# Patient Record
Sex: Female | Born: 1970 | Race: White | Hispanic: No | Marital: Married | Smoking: Former smoker
Health system: Southern US, Community
[De-identification: ages and names within clinical notes are randomized; demographics above are authoritative.]

## PROBLEM LIST (undated history)

## (undated) DIAGNOSIS — J309 Allergic rhinitis, unspecified: Secondary | ICD-10-CM

## (undated) DIAGNOSIS — G479 Sleep disorder, unspecified: Secondary | ICD-10-CM

## (undated) DIAGNOSIS — G4730 Sleep apnea, unspecified: Secondary | ICD-10-CM

## (undated) DIAGNOSIS — I5189 Other ill-defined heart diseases: Secondary | ICD-10-CM

## (undated) DIAGNOSIS — G43909 Migraine, unspecified, not intractable, without status migrainosus: Secondary | ICD-10-CM

## (undated) HISTORY — DX: Other ill-defined heart diseases: I51.89

## (undated) HISTORY — DX: Sleep disorder, unspecified: G47.9

## (undated) HISTORY — DX: Migraine, unspecified, not intractable, without status migrainosus: G43.909

## (undated) HISTORY — PX: VAGINAL HYSTERECTOMY: SUR661

## (undated) HISTORY — DX: Sleep apnea, unspecified: G47.30

## (undated) HISTORY — PX: CHOLECYSTECTOMY: SHX55

## (undated) HISTORY — DX: Allergic rhinitis, unspecified: J30.9

---

## 2018-03-22 ENCOUNTER — Encounter: Payer: Self-pay | Admitting: *Deleted

## 2018-03-23 ENCOUNTER — Ambulatory Visit: Payer: BLUE CROSS/BLUE SHIELD | Admitting: Cardiology

## 2018-03-23 ENCOUNTER — Encounter: Payer: Self-pay | Admitting: Cardiology

## 2018-03-23 VITALS — BP 148/78 | HR 74 | Wt 201.0 lb

## 2018-03-23 DIAGNOSIS — I340 Nonrheumatic mitral (valve) insufficiency: Secondary | ICD-10-CM

## 2018-03-23 DIAGNOSIS — R002 Palpitations: Secondary | ICD-10-CM | POA: Diagnosis not present

## 2018-03-23 DIAGNOSIS — R079 Chest pain, unspecified: Secondary | ICD-10-CM | POA: Diagnosis not present

## 2018-03-23 DIAGNOSIS — R200 Anesthesia of skin: Secondary | ICD-10-CM | POA: Diagnosis not present

## 2018-03-23 DIAGNOSIS — R202 Paresthesia of skin: Secondary | ICD-10-CM | POA: Diagnosis not present

## 2018-03-23 NOTE — Progress Notes (Signed)
Clinical Summary Ms. Sanden is a 47 y.o.female seen as new consult, referred by Dr Richardson Doppole for chest pain.   1. Chest pain/SOB - [facility] - previously followed by Dr Catha GosselinBanerjee - [facility] her report - CT PE during recent admission negative, no PE or dissection. Trop neg x 4. CXR no acute process 02/2018 echo [facility]: LVEF 55%, no valve pathology - plan was for outpatient nuclear stress test  - reports recent denial on stress test by insurance.   - several years of chest pain - recent symptoms have changed.  - SOB/DOE over the last few months, example basement stepts - felt lightheaded, felt nauseous that increased. Severe SOB, tongue spasms. Had to lay down - onset of chest pressure, palpitations. Pressure midchest, severe heaviness. Not positional. BP up to 160/100 - used NG x 3 with mild benefit. Lasted about 4 hrs roughly.  - has had some mild chest pain since that time. Can occur at any time. Still with episodes of palpitations. - sedentary lifestyle. Has had some SOB with her activities.   CAD risk factors: former  Tobacco x 10 years.   2. Palpitations - notes high heart rates at times, has confirmed with prior monitor - 2 cups of coffee daily, occasional tea. Occasional sodas. Ocassional EtOH. - several episodes a week. Can take breath.    3. Weakness -ms evaluation per neurology    4. OSA - on cpap  5. 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 Friends with Eliseo SquiresJoe Gravely who is also a patient of mine. Mother is a patient of mine.   Past Medical History:  Diagnosis Date  . Allergic rhinitis   . Diastolic dysfunction   . Migraine headache   . Sleep apnea   . Sleep disorder      Allergies  Allergen Reactions  . Biaxin [Clarithromycin]   . Other     PSEUDOEPHEDRINE - WEAKNESS   . Phenergan [Promethazine Hcl]   . Zithromax [Azithromycin]    RASHES/HIVES     Current Outpatient Medications  Medication Sig Dispense Refill  . estradiol (ESTRACE) 1 MG tablet Take 1 mg by mouth daily.     No current facility-administered medications for this visit.         Allergies  Allergen Reactions  . Biaxin [Clarithromycin]   . Other     PSEUDOEPHEDRINE - WEAKNESS   . Phenergan [Promethazine Hcl]   . Zithromax [Azithromycin]     RASHES/HIVES      No family history on file.   Social History Ms. Remick has no tobacco history on file. Ms. Mosetta AnisGammons has no alcohol history on file.   Review of Systems CONSTITUTIONAL: No weight loss, fever, chills, weakness or fatigue.  HEENT: Eyes: No visual loss, blurred vision, double vision or yellow sclerae.No hearing loss, sneezing, congestion, runny nose or sore throat.  SKIN: No rash or itching.  CARDIOVASCULAR: per hpi RESPIRATORY: No shortness of breath, cough or sputum.  GASTROINTESTINAL: No anorexia, nausea, vomiting or diarrhea. No abdominal pain or blood.  GENITOURINARY: No burning on urination, no polyuria NEUROLOGICAL: No headache, dizziness, syncope, paralysis, ataxia, numbness or tingling in the extremities. No change in bowel or bladder control.  MUSCULOSKELETAL: No muscle, back pain, joint pain or stiffness.  LYMPHATICS: No enlarged nodes. No history of splenectomy.  PSYCHIATRIC: No history of depression or anxiety.  ENDOCRINOLOGIC: No reports of  sweating, cold or heat intolerance. No polyuria or polydipsia.  Marland Kitchen   Physical Examination Vitals:   03/23/18 1014  BP: (!) 148/78  Pulse: 74  SpO2: 99%   Vitals:   03/23/18 1014  Weight: 201 lb (91.2 kg)    Gen: resting comfortably, no acute distress HEENT: no scleral icterus, pupils equal round and reactive, no palptable cervical adenopathy,  CV: RRR, 2/6 sysotlic murmur apex, no jvd Resp: Clear to auscultation bilaterally GI: abdomen is soft, non-tender, non-distended, normal bowel sounds, no  hepatosplenomegaly MSK: extremities are warm, no edema.  Skin: warm, no rash Neuro:  no focal deficits Psych: appropriate affect   Diagnostic Studies 02/2016 echo [facility] Interpretation Summary A complete two-dimensional transthoracic echocardiogram with color flow Doppler and spectral Doppler was performed. There is no comparison study available. The left ventricle is normal in size, wall thickness and wall motion with ejection fraction of 55-60%. The mitral valve is normal in structure with mild to moderate [1-2+] regurgitation. The aortic valve is trileaflet with thin, pliable leaflets that move normally.  Left Ventricle The left ventricle is normal in size, wall thickness and wall motion with ejection fraction of 55-60%.   Right Ventricle The right ventricle is grossly normal in size and function.  Atria The left and right atria are normal size.  Mitral Valve The mitral valve is normal in structure with mild to moderate [1-2+] regurgitation.   Tricuspid Valve The tricuspid valve is normal in structure and function. There is trace tricuspid regurgitation.  Aortic Valve The aortic valve is trileaflet with thin, pliable leaflets that move normally.  Pulmonic Valve The pulmonic valve is not well seen, but is grossly normal.  Vessels The aortic root is normal in diameter.  Pericardium There is no pericardial effusion.   02/2016 nuclearr stress test [facility] Acute Interface, Incoming Rad Results - 03/12/2016  3:39 PM EDT INDICATION: Chest pain  TECHNIQUE: Stress and resting perfusion images following intravenous administration of a total of 50.4 mCi technetium 40m Cardiolite.  FINDINGS: No fixed or vertebral defects to suggest infarct or ischemia. Left ventricular ejection fraction is calculated to be 62%. Normal wall motion   IMPRESSION: Normal study. Assessment and Plan  1. Chest pain - we will plan for an exercise nuclear stress test to further evaluate  her symptoms  2. Palpitations - obtain 2 week event monitor  3. Mitral regurgtation - mild to moderate by 2017 echo. Continue to monitor.       Antoine Poche, M.D.,

## 2018-03-23 NOTE — Patient Instructions (Signed)
Medication Instructions:  Your physician recommends that you continue on your current medications as directed. Please refer to the Current Medication list given to you today.  Labwork: NONE  Testing/Procedures: Your physician has requested that you have en exercise stress myoview. For further information please visit [email or web address]. Please follow instruction sheet, as given.  Follow-Up: Your physician recommends that you schedule a follow-up appointment PENDING TEST RESULTS  Any Other Special Instructions Will Be Listed Below (If Applicable).  If you need a refill on your cardiac medications before your next appointment, please call your pharmacy. 

## 2018-03-30 ENCOUNTER — Encounter: Payer: Self-pay | Admitting: Cardiology

## 2018-03-31 ENCOUNTER — Encounter (HOSPITAL_COMMUNITY): Payer: Self-pay

## 2018-03-31 ENCOUNTER — Encounter (HOSPITAL_BASED_OUTPATIENT_CLINIC_OR_DEPARTMENT_OTHER)
Admission: RE | Admit: 2018-03-31 | Discharge: 2018-03-31 | Disposition: A | Discharge status: Home / Self Care | Payer: BLUE CROSS/BLUE SHIELD | Source: Ambulatory Visit | Attending: Cardiology | Admitting: Cardiology

## 2018-03-31 ENCOUNTER — Encounter (HOSPITAL_COMMUNITY)
Admission: RE | Admit: 2018-03-31 | Discharge: 2018-03-31 | Disposition: A | Discharge status: Home / Self Care | Payer: BLUE CROSS/BLUE SHIELD | Source: Ambulatory Visit | Attending: Cardiology | Admitting: Cardiology

## 2018-03-31 DIAGNOSIS — R079 Chest pain, unspecified: Secondary | ICD-10-CM

## 2018-03-31 LAB — NM MYOCAR MULTI W/SPECT W/WALL MOTION / EF
CHL CUP MPHR: 172 {beats}/min
CHL CUP NUCLEAR SDS: 0
CSEPED: 7 min
CSEPHR: 99 %
CSEPPHR: 171 {beats}/min
Estimated workload: 9 METS
Exercise duration (sec): 31 s
LHR: 0.41
LV sys vol: 22 mL
LVDIAVOL: 61 mL (ref 46–106)
NUC STRESS TID: 0.96
RPE: 15
Rest HR: 74 {beats}/min
SRS: 0
SSS: 0

## 2018-03-31 MED ORDER — REGADENOSON 0.4 MG/5ML IV SOLN
INTRAVENOUS | Status: AC
  Filled 2018-03-31: qty 5

## 2018-03-31 MED ORDER — TECHNETIUM TC 99M TETROFOSMIN IV KIT
30.0000 | PACK | Freq: Once | INTRAVENOUS | Status: AC | PRN
  Administered 2018-03-31: 33 via INTRAVENOUS

## 2018-03-31 MED ORDER — SODIUM CHLORIDE 0.9% FLUSH
INTRAVENOUS | Status: AC
  Administered 2018-03-31: 10 mL via INTRAVENOUS
  Filled 2018-03-31: qty 10

## 2018-03-31 MED ORDER — TECHNETIUM TC 99M TETROFOSMIN IV KIT
10.0000 | PACK | Freq: Once | INTRAVENOUS | Status: AC | PRN
  Administered 2018-03-31: 11 via INTRAVENOUS

## 2018-04-04 ENCOUNTER — Telehealth: Payer: Self-pay | Admitting: *Deleted

## 2018-04-04 DIAGNOSIS — R079 Chest pain, unspecified: Secondary | ICD-10-CM

## 2018-04-04 DIAGNOSIS — R002 Palpitations: Secondary | ICD-10-CM

## 2018-04-04 NOTE — Telephone Encounter (Signed)
-----   Message from Antoine PocheJonathan F Branch, MD sent at 04/03/2018  1:26 PM EDT ----- Also, she does need a 2 week event monitor arranged for palpitations. Keep her f/u pending until that is resulted please   Dina RichJonathan BrancH MD ----- Message ----- From: Antoine PocheBranch, Jonathan F, MD Sent: 04/03/2018   1:24 PM To: [name] T Demetress Tift, CMA  Stress test looks great based on every single parameter. No evidence of any blockages as the cause of her symptoms. She needs to f/u with pcp to discuss other noncardaic causes of her symptoms. Routine f/u with us 6 months   Dina RichJonathan Branch MD

## 2018-04-04 NOTE — Telephone Encounter (Signed)
Pt aware and agreeable to event monitor - routed to pcp

## 2018-05-03 ENCOUNTER — Telehealth: Payer: Self-pay

## 2018-05-03 NOTE — Telephone Encounter (Signed)
Patient contacted office today to get results from monitor. Advised patient results were not available at this time but would call patient as soon as they were.

## 2018-05-12 ENCOUNTER — Telehealth: Payer: Self-pay | Admitting: *Deleted

## 2018-05-12 NOTE — Telephone Encounter (Signed)
Pt aware and voiced understanding - declined medication at this time says palpitations vary - 6 week f/u scheduled - routed to pcp

## 2018-05-12 NOTE — Telephone Encounter (Signed)
-----   Message from Antoine PocheJonathan F Branch, MD sent at 05/10/2018  4:11 PM EDT ----- Heart monitor overall looks good. Normal rhythm with just some occasional extra heart beats. This is not anything worrisome and is considered benign but can cause some palpitations. . If she is still having a lot of palpitations we can try her on a medication, lopressor 12.5mg  bid to see if it helps. Continue to limit caffeine and alcohol. F/u 6 weeks   Dominga FerryJ Branch MD

## 2018-06-13 ENCOUNTER — Ambulatory Visit: Payer: BLUE CROSS/BLUE SHIELD | Admitting: Cardiology

## 2018-06-13 ENCOUNTER — Encounter: Payer: Self-pay | Admitting: Cardiology

## 2018-06-13 ENCOUNTER — Other Ambulatory Visit: Payer: Self-pay

## 2018-06-13 VITALS — BP 112/75 | HR 65 | Ht 65.0 in | Wt 212.0 lb

## 2018-06-13 DIAGNOSIS — R0789 Other chest pain: Secondary | ICD-10-CM

## 2018-06-13 DIAGNOSIS — R600 Localized edema: Secondary | ICD-10-CM | POA: Diagnosis not present

## 2018-06-13 DIAGNOSIS — I340 Nonrheumatic mitral (valve) insufficiency: Secondary | ICD-10-CM

## 2018-06-13 DIAGNOSIS — R002 Palpitations: Secondary | ICD-10-CM | POA: Diagnosis not present

## 2018-06-13 MED ORDER — FUROSEMIDE 20 MG PO TABS: ORAL_TABLET | ORAL | 0 refills | Status: AC

## 2018-06-13 NOTE — Progress Notes (Signed)
Clinical Summary Penny Mahoney is a 47 y.o.female seen today for follow up of the following medical problems.    1. Chest pain/SOB - recent admit to [facility] - previously followed by Dr Catha Gosselin - prior stress at [facility] negative per her report - CT PE during recent admission negative, no PE or dissection. Trop neg x 4. CXR no acute process 02/2018 echo [facility]: LVEF 55%, no valve pathology - plan was for outpatient nuclear stress test but insurance denies  - several years of chest pain - recent symptoms have changed.  - SOB/DOE over the last few months, example basement stepts - felt lightheaded, felt nauseous that increased. Severe SOB, tongue spasms. Had to lay down - onset of chest pressure, palpitations. Pressure midchest, severe heaviness. Not positional. BP up to 160/100 - used NG x 3 with mild benefit. Lasted about 4 hrs roughly.  - has had some mild chest pain since that time. Can occur at any time. Still with episodes of palpitations. - sedentary lifestyle. Has had some SOB with her activities.   CAD risk factors: former  Tobacco x 10 years.     03/2018 exercise nuclear stress: no EKG changes, went 8 minutes, Duke score low risk at 8, no perfusion defects - deneis any recent issues with chest pain.    2. Palpitations - notes high heart rates at times, has confirmed with prior monitor - 2 cups of coffee daily, occasional tea. Occasional sodas. Ocassional EtOH. - several episodes a week. Can take breath.   03/2018 14 day event monitor  14 day event monitor  Min HR 59, Max HR 143, Avg HR 84  Reported symptoms correlate with sinus rhythm with occasional PACs and PVCs  No significant arrhythmias    3. Weakness -ms evaluation per neurology   4. OSA - on cpap  5. 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 Friends with Penny Mahoney who is also a patient  of mine. Penny Mahoney is a patient of mine.      Past Medical History:  Diagnosis Date  . Allergic rhinitis   . Diastolic dysfunction   . Migraine headache   . Sleep apnea   . Sleep disorder      Allergies  Allergen Reactions  . Biaxin [Clarithromycin]   . Influenza Vaccines   . Other     PSEUDOEPHEDRINE - WEAKNESS   . Phenergan [Promethazine Hcl]   . Tetanus Toxoids   . Zithromax [Azithromycin]     RASHES/HIVES     Current Outpatient Medications  Medication Sig Dispense Refill  . estradiol (ESTRACE) 1 MG tablet Take 1 mg by mouth daily.     No current facility-administered medications for this visit.      Past Surgical History:  Procedure Laterality Date  . CHOLECYSTECTOMY    . VAGINAL HYSTERECTOMY       Allergies  Allergen Reactions  . Biaxin [Clarithromycin]   . Influenza Vaccines   . Other     PSEUDOEPHEDRINE - WEAKNESS   . Phenergan [Promethazine Hcl]   . Tetanus Toxoids   . Zithromax [Azithromycin]     RASHES/HIVES      Family History  Problem Relation Age of Onset  . Hypertension Penny Mahoney   . Stroke Father   . Diabetes Sister   . Atrial fibrillation Maternal Grandfather      Social History Penny Mahoney reports that she  has never smoked. She has never used smokeless tobacco. Penny Mahoney reports that she does not drink alcohol.   Review of Systems CONSTITUTIONAL: No weight loss, fever, chills, weakness or fatigue.  HEENT: Eyes: No visual loss, blurred vision, double vision or yellow sclerae.No hearing loss, sneezing, congestion, runny nose or sore throat.  SKIN: No rash or itching.  CARDIOVASCULAR: per hpi RESPIRATORY: No shortness of breath, cough or sputum.  GASTROINTESTINAL: No anorexia, nausea, vomiting or diarrhea. No abdominal pain or blood.  GENITOURINARY: No burning on urination, no polyuria NEUROLOGICAL: No headache, dizziness, syncope, paralysis, ataxia, numbness or tingling in the extremities. No change in bowel or bladder  control.  MUSCULOSKELETAL: No muscle, back pain, joint pain or stiffness.  LYMPHATICS: No enlarged nodes. No history of splenectomy.  PSYCHIATRIC: No history of depression or anxiety.  ENDOCRINOLOGIC: No reports of sweating, cold or heat intolerance. No polyuria or polydipsia.  Marland Kitchen.   Physical Examination Vitals:   06/13/18 1018  BP: 112/75  Pulse: 65  SpO2: 99%   Vitals:   06/13/18 1018  Weight: 212 lb (96.2 kg)  Height: 5\' 5"  (1.651 m)    Gen: resting comfortably, no acute distress HEENT: no scleral icterus, pupils equal round and reactive, no palptable cervical adenopathy,  CV: RRR, no m/r/g, no jvd Resp: Clear to auscultation bilaterally GI: abdomen is soft, non-tender, non-distended, normal bowel sounds, no hepatosplenomegaly MSK: extremities are warm, no edema.  Skin: warm, no rash Neuro:  no focal deficits Psych: appropriate affect   Diagnostic Studies 02/2016 [facility] Interpretation Summary A complete two-dimensional transthoracic echocardiogram with color flow Doppler and spectral Doppler was performed. There is no comparison study available. The left ventricle is normal in size, wall thickness and wall motion with ejection fraction of 55-60%. The mitral valve is normal in structure with mild to moderate [1-2+] regurgitation. The aortic valve is trileaflet with thin, pliable leaflets that move normally.  Left Ventricle The left ventricle is normal in size, wall thickness and wall motion with ejection fraction of 55-60%.   Right Ventricle The right ventricle is grossly normal in size and function.  Atria The left and right atria are normal size.  Mitral Valve The mitral valve is normal in structure with mild to moderate [1-2+] regurgitation.   Tricuspid Valve The tricuspid valve is normal in structure and function. There is trace tricuspid regurgitation.  Aortic Valve The aortic valve is trileaflet with thin, pliable leaflets that  move normally.  Pulmonic Valve The pulmonic valve is not well seen, but is grossly normal.  Vessels The aortic root is normal in diameter.  Pericardium There is no pericardial effusion.   02/2016 nuclearr stress test [facility] Acute Interface, Incoming Rad Results - 03/12/2016  3:39 PM EDT INDICATION: Chest pain  TECHNIQUE: Stress and resting perfusion images following intravenous administration of a total of 50.4 mCi technetium 941m Cardiolite.  FINDINGS: No fixed or vertebral defects to suggest infarct or ischemia. Left ventricular ejection fraction is calculated to be 62%. Normal wall motion  03/2018 exercise nuclear  Blood pressure demonstrated a normal response to exercise.  There was no ST segment deviation noted during stress.  The study is normal. There are no perfusion defects  This is a low risk study.  The left ventricular ejection fraction is normal (55-65%).  Low risk Duke treadmill score of 8   03/2018 14 day event monitor  14 day event monitor  Min HR 59, Max HR 143, Avg HR 84  Reported symptoms correlate  with sinus rhythm with occasional PACs and PVCs  No significant arrhythmias   Assessment and Plan   1. Chest pain - negative stress test by all measured parameters - no plans for further cardiac testing at this time   2. Palpitations - benign ectopy on recent monitor.  - overall symptoms tolerable, consider low dose beta blocker of CCB if progression of symptoms.   3. Mitral regurgtation - mild, continue to monitor at this time  4. LE edema - intermittent, start lasix prn.     F/ 1 year   Antoine Poche, M.D.

## 2018-06-13 NOTE — Patient Instructions (Signed)
Your physician wants you to follow-up in: 1 YEAR [facility] You will receive a reminder letter in the mail two months in advance. If you don't receive a letter, please call our office to schedule the follow-up appointment.  Your physician has recommended you make the following change in your medication:   START LASIX 20 MG AS NEEDED FOR SWELLING   Thank you for choosing [facility]!!

## 2018-06-21 ENCOUNTER — Encounter: Payer: Self-pay | Admitting: Cardiology

## 2019-12-19 ENCOUNTER — Encounter: Payer: Self-pay | Admitting: Family Medicine

## 2019-12-19 ENCOUNTER — Telehealth (INDEPENDENT_AMBULATORY_CARE_PROVIDER_SITE_OTHER): Payer: Self-pay | Admitting: Cardiology

## 2019-12-19 VITALS — BP 142/92 | HR 84 | Ht 65.0 in | Wt 213.0 lb

## 2019-12-19 DIAGNOSIS — R0789 Other chest pain: Secondary | ICD-10-CM

## 2019-12-19 DIAGNOSIS — I340 Nonrheumatic mitral (valve) insufficiency: Secondary | ICD-10-CM

## 2019-12-19 DIAGNOSIS — R600 Localized edema: Secondary | ICD-10-CM

## 2019-12-19 DIAGNOSIS — R002 Palpitations: Secondary | ICD-10-CM

## 2019-12-19 MED ORDER — METOPROLOL TARTRATE 25 MG PO TABS: 12.5000 mg | ORAL_TABLET | Freq: Two times a day (BID) | ORAL | Status: AC

## 2019-12-19 NOTE — Progress Notes (Signed)
Opened in error

## 2019-12-19 NOTE — Patient Instructions (Signed)
Medication Instructions:   Lopressor 12.5mg  twice a day  - added to list today - per primary MD suggestion.  Continue all other medications.    Labwork: none  Testing/Procedures: none  Follow-Up: Your physician wants you to follow up in: 6 months.  You will receive a reminder letter in the mail one-two months in advance.  If you don't receive a letter, please call our office to schedule the follow up appointment   Any Other Special Instructions Will Be Listed Below (If Applicable).  If you need a refill on your cardiac medications before your next appointment, please call your pharmacy.

## 2019-12-19 NOTE — Addendum Note (Signed)
Addended by: Lesle Chris on: 12/19/2019 04:23 PM   Modules accepted: Orders

## 2019-12-19 NOTE — Progress Notes (Signed)
Virtual Visit via Telephone Note   This visit type was conducted due to national recommendations for restrictions regarding the COVID-19 Pandemic (e.g. social distancing) in an effort to limit this patient's exposure and mitigate transmission in our community.  Due to her co-morbid illnesses, this patient is at least at moderate risk for complications without adequate follow up.  This format is felt to be most appropriate for this patient at this time.  The patient did not have access to video technology/had technical difficulties with video requiring transitioning to audio format only (telephone).  All issues noted in this document were discussed and addressed.  No physical exam could be performed with this format.  Please refer to the patient's chart for her  consent to telehealth for [facility].   Date:  12/19/2019   ID:  Penny Mahoney, DOB 1971/06/02, MRN [id number]  Patient Location: Home Provider Location: Office  PCP:  Vick Frees, MD  Cardiologist:  Dina Rich, MD  Electrophysiologist:  None   Evaluation Performed:  Follow-Up Visit  Chief Complaint:  Follow up  History of Present Illness:    Penny Mahoney is a 49 y.o. female seen today for follow up of the following medical problems.    1. Chest pain/SOB - recent admit to [facility] - previously followed by Dr Catha Gosselin - prior stress at [facility] negative per her report - CT PE during recent admission negative, no PE or dissection. Trop neg x 4. CXR no acute process 02/2018 echo [facility]: LVEF 55%, no valve pathology - plan was for outpatient nuclear stress test but insurance denies  - several years of chest pain - recent symptoms have changed.  - SOB/DOE over the last few months, example basement stepts - felt lightheaded, felt nauseous that increased. Severe SOB, tongue spasms. Had to lay down - onset of chest pressure, palpitations. Pressure midchest, severe heaviness. Not positional. BP up to  160/100 - used NG x 3 with mild benefit. Lasted about 4 hrs roughly.  - has had some mild chest pain since that time. Can occur at any time. Still with episodes of palpitations. - sedentary lifestyle. Has had some SOB with her activities.   CAD risk factors: former Tobacco x 10 years.     03/2018 exercise nuclear stress: no EKG changes, went 8 minutes, Duke score low risk at 8, no perfusion defects  - some chest pain at times. Episode 1 week ago. Overall unchanged from her chronic symptoms.   2. Palpitations  03/2018 14 day event monitor  14 day event monitor  Min HR 59, Max HR 143, Avg HR 84  Reported symptoms correlate with sinus rhythm with occasional PACs and PVCs  No significant arrhythmias  - can have some episodes of palpitations. At times up to  HRs up to 130    3. OSA - on cpap  4. Mitral regurgitation - mild to moderate based on 2017 [facility] echo - [facility] echo 02/2018 mild MR  5. 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 Friends with Eliseo Squires who is also a patient of mine. Mother is a patient of mine.  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   The patient does not have symptoms concerning for COVID-19 infection (fever, chills, cough, or new shortness of breath).    Past Medical History:  Diagnosis Date  . Allergic rhinitis   . Diastolic dysfunction   .  Migraine headache   . Sleep apnea   . Sleep disorder    Past Surgical History:  Procedure Laterality Date  . CHOLECYSTECTOMY    . VAGINAL HYSTERECTOMY       Current Meds  Medication Sig  . ALPRAZolam (XANAX) 0.25 MG tablet Take 0.125 mg by mouth at bedtime as needed.  Marland Kitchen aspirin EC 81 MG tablet Take 81 mg by mouth daily.  . furosemide (LASIX) 20 MG tablet TAKE 1 TABLET AS NEEDED FOR SWELLING  . hydrOXYzine (ATARAX/VISTARIL) 10 MG tablet Take 10-20 mg by mouth 3 (three) times daily as needed for  anxiety.  Marland Kitchen omeprazole (PRILOSEC) 20 MG capsule Take 20 mg by mouth daily.  Marland Kitchen triamcinolone (KENALOG) 0.025 % cream Apply 1 application topically as needed.     Allergies:   Biaxin [clarithromycin], Influenza vaccines, Other, Phenergan [promethazine hcl], Tetanus toxoids, and Zithromax [azithromycin]   Social History   Tobacco Use  . Smoking status: Former Smoker    Types: Cigarettes    Quit date: 12/13/2016    Years since quitting: 3.0  . Smokeless tobacco: Never Used  . Tobacco comment: smoked off/on x 10-15 years   Substance Use Topics  . Alcohol use: Never    Comment: occasional  . Drug use: Never     Family Hx: The patient's family history includes Atrial fibrillation in her maternal grandfather; Diabetes in her sister; Hypertension in her mother; Stroke in her father.  ROS:   Please see the history of present illness.     All other systems reviewed and are negative.   Prior CV studies:   The following studies were reviewed today:  02/2016 echo [facility] Interpretation Summary A complete two-dimensional transthoracic echocardiogram with color flow Doppler and spectral Doppler was performed. There is no comparison study available. The left ventricle is normal in size, wall thickness and wall motion with ejection fraction of 55-60%. The mitral valve is normal in structure with mild to moderate [1-2+] regurgitation. The aortic valve is trileaflet with thin, pliable leaflets that move normally.  Left Ventricle The left ventricle is normal in size, wall thickness and wall motion with ejection fraction of 55-60%.   Right Ventricle The right ventricle is grossly normal in size and function.  Atria The left and right atria are normal size.  Mitral Valve The mitral valve is normal in structure with mild to moderate [1-2+] regurgitation.   Tricuspid Valve The tricuspid valve is normal in structure and function. There is trace tricuspid regurgitation.  Aortic  Valve The aortic valve is trileaflet with thin, pliable leaflets that move normally.  Pulmonic Valve The pulmonic valve is not well seen, but is grossly normal.  Vessels The aortic root is normal in diameter.  Pericardium There is no pericardial effusion.   02/2016 nuclearr stress test [facility], Incoming Rad Results - 03/12/2016 3:39 PM EDT INDICATION: Chest pain  TECHNIQUE: Stress and resting perfusion images following intravenous administration of a total of 50.4 mCi technetium 59m Cardiolite.  FINDINGS: No fixed or vertebral defects to suggest infarct or ischemia. Left ventricular ejection fraction is calculated to be 62%. Normal wall motion  03/2018 exercise nuclear  Blood pressure demonstrated a normal response to exercise.  There was no ST segment deviation noted during stress.  The study is normal. There are no perfusion defects  This is a low risk study.  The left ventricular ejection fraction is normal (55-65%).  Low risk Duke treadmill score of 8   03/2018 14 day  event monitor  14 day event monitor  Min HR 59, Max HR 143, Avg HR 84  Reported symptoms correlate with sinus rhythm with occasional PACs and PVCs  No significant arrhythmias  Labs/Other Tests and Data Reviewed:    EKG:  No ECG reviewed.  Recent Labs: No results found for requested labs within last 8760 hours.   Recent Lipid Panel No results found for: CHOL, TRIG, HDL, CHOLHDL, LDLCALC, LDLDIRECT  Wt Readings from Last 3 Encounters:  12/19/19 213 lb (96.6 kg)  06/13/18 212 lb (96.2 kg)  03/23/18 201 lb (91.2 kg)     Objective:    Vital Signs:  BP (!) 142/92   Pulse 84   Ht 5\' 5"  (1.651 m)   Wt 213 lb (96.6 kg)   BMI 35.45 kg/m    Normal affect. Normal speech pattern and tone. Comfortable, no apparent distress. No audible signs of SOB or wheezing.   ASSESSMENT & PLAN:    1. Chest pain - negative stress test by all measured parameters - chronic symptoms  unchnaged, no additional cardiac testing planned at this time.    2. Palpitations - benign ectopy on recent monitor.  - she has been hesitant to try medical therapy but will try the lopressor her pcp had prescribed, I have recommended she take lopressor as 12.5mg  bid.   3. Mitral regurgtation - just mild by echo, continue to monitor.     COVID-19 Education: The signs and symptoms of COVID-19 were discussed with the patient and how to seek care for testing (follow up with PCP or arrange E-visit).  The importance of social distancing was discussed today.  Time:   Today, I have spent 15 minutes with the patient with telehealth technology discussing the above problems.     Medication Adjustments/Labs and Tests Ordered: Current medicines are reviewed at length with the patient today.  Concerns regarding medicines are outlined above.   Tests Ordered: No orders of the defined types were placed in this encounter.   Medication Changes: No orders of the defined types were placed in this encounter.   Follow Up:  In Person in 6 month(s)  Signed, , MD  12/19/2019 3:43 PM    [facility]

## 2019-12-20 IMAGING — NM NM MYOCAR MULTI W/SPECT W/WALL MOTION & EF
2 series · 12 of 12 positions shown · non-contrast
Comparison: none

[Series 1: rest · 6.51mm/px · 6 of 64 frames shown]
[frame 6/64]
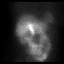
[frame 16/64]
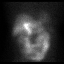
[frame 27/64]
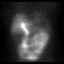
[frame 38/64]
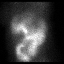
[frame 48/64]
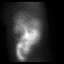
[frame 59/64]
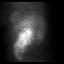

[Series 3: stress gated - perfusion · 6.51mm/px · 6 of 64 frames shown]
[frame 6/64]
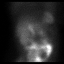
[frame 16/64]
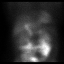
[frame 27/64]
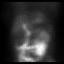
[frame 38/64]
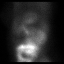
[frame 48/64]
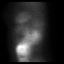
[frame 59/64]
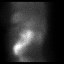

[12 of 12 positions shown; findings below may reference images not displayed]

Canned report from images found in remote index.

Refer to host system for actual result text.

## 2020-01-04 NOTE — Telephone Encounter (Signed)
The lopresor was mainly to see if it would help with some of the palpitatons she was having. If she did not tolerate then can stop taking. If palpitations are tolerable then we do not need to try an alternative, if having lots of symptoms could consider trying another medication   Dominga Ferry MD
